# Patient Record
Sex: Male | Born: 1995 | Hispanic: No | Marital: Single | Smoking: Current every day smoker
Health system: Southern US, Community
[De-identification: ages and names within clinical notes are randomized; demographics above are authoritative.]

---
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
## 2017-08-21 ENCOUNTER — Encounter: Payer: Self-pay | Admitting: Emergency Medicine

## 2017-08-21 ENCOUNTER — Emergency Department

## 2017-08-21 ENCOUNTER — Emergency Department
Admission: EM | Admit: 2017-08-21 | Discharge: 2017-08-21 | Disposition: A | Discharge status: Home / Self Care | Attending: Emergency Medicine | Admitting: Emergency Medicine

## 2017-08-21 ENCOUNTER — Other Ambulatory Visit: Payer: Self-pay

## 2017-08-21 DIAGNOSIS — F17290 Nicotine dependence, other tobacco product, uncomplicated: Secondary | ICD-10-CM | POA: Diagnosis not present

## 2017-08-21 DIAGNOSIS — R1010 Upper abdominal pain, unspecified: Secondary | ICD-10-CM | POA: Insufficient documentation

## 2017-08-21 DIAGNOSIS — R070 Pain in throat: Secondary | ICD-10-CM | POA: Diagnosis not present

## 2017-08-21 LAB — URINALYSIS, COMPLETE (UACMP) WITH MICROSCOPIC
Bacteria, UA: NONE SEEN
Bilirubin Urine: NEGATIVE
GLUCOSE, UA: NEGATIVE mg/dL
Hgb urine dipstick: NEGATIVE
KETONES UR: NEGATIVE mg/dL
Leukocytes, UA: NEGATIVE
Nitrite: NEGATIVE
PH: 7 (ref 5.0–8.0)
Protein, ur: 30 mg/dL — AB
SPECIFIC GRAVITY, URINE: 1.03 (ref 1.005–1.030)

## 2017-08-21 LAB — CBC
HCT: 45.5 % (ref 40.0–52.0)
Hemoglobin: 15.7 g/dL (ref 13.0–18.0)
MCH: 29.9 pg (ref 26.0–34.0)
MCHC: 34.5 g/dL (ref 32.0–36.0)
MCV: 86.7 fL (ref 80.0–100.0)
PLATELETS: 332 10*3/uL (ref 150–440)
RBC: 5.25 MIL/uL (ref 4.40–5.90)
RDW: 12 % (ref 11.5–14.5)
WBC: 7.4 10*3/uL (ref 3.8–10.6)

## 2017-08-21 LAB — COMPREHENSIVE METABOLIC PANEL
ALK PHOS: 61 U/L (ref 38–126)
ALT: 18 U/L (ref 17–63)
AST: 25 U/L (ref 15–41)
Albumin: 4.3 g/dL (ref 3.5–5.0)
Anion gap: 11 (ref 5–15)
BILIRUBIN TOTAL: 0.8 mg/dL (ref 0.3–1.2)
BUN: 17 mg/dL (ref 6–20)
CALCIUM: 9.6 mg/dL (ref 8.9–10.3)
CO2: 28 mmol/L (ref 22–32)
CREATININE: 1.17 mg/dL (ref 0.61–1.24)
Chloride: 101 mmol/L (ref 101–111)
Glucose, Bld: 103 mg/dL — ABNORMAL HIGH (ref 65–99)
Potassium: 4.2 mmol/L (ref 3.5–5.1)
Sodium: 140 mmol/L (ref 135–145)
Total Protein: 9 g/dL — ABNORMAL HIGH (ref 6.5–8.1)

## 2017-08-21 LAB — LIPASE, BLOOD: Lipase: 25 U/L (ref 11–51)

## 2017-08-21 MED ORDER — PANTOPRAZOLE SODIUM 40 MG PO TBEC: 40.0000 mg | DELAYED_RELEASE_TABLET | Freq: Every day | ORAL | 1 refills | Status: AC

## 2017-08-21 MED ORDER — POLYETHYLENE GLYCOL 3350 17 G PO PACK: 17.0000 g | PACK | Freq: Every day | ORAL | 0 refills | Status: AC

## 2017-08-21 MED ORDER — GI COCKTAIL ~~LOC~~
30.0000 mL | Freq: Once | ORAL | Status: AC
  Administered 2017-08-21: 30 mL via ORAL
  Filled 2017-08-21: qty 30

## 2017-08-21 MED ORDER — SUCRALFATE 1 G PO TABS: 1.0000 g | ORAL_TABLET | Freq: Four times a day (QID) | ORAL | 1 refills | Status: AC

## 2017-08-21 MED ORDER — SODIUM CHLORIDE 0.9 % IV SOLN
Freq: Once | INTRAVENOUS | Status: AC
  Administered 2017-08-21: 14:00:00 via INTRAVENOUS

## 2017-08-21 NOTE — ED Notes (Signed)
Patient transported to X-ray 

## 2017-08-21 NOTE — ED Triage Notes (Signed)
Pt states upper abd pain for the past few days, hurts worse when he swallows, states he's lost 6lbs in the past 4 days, unable to eat at all. Denies injury to area. Appears in NAD.

## 2017-08-21 NOTE — ED Provider Notes (Signed)
[facility] Emergency Department Provider Note       Time seen: ----------------------------------------- 3:16 PM on 08/21/2017 -----------------------------------------   I have reviewed the triage vital signs and the nursing notes.  HISTORY   Chief Complaint Abdominal Pain    HPI Kyle Hodges is a 22 y.o. male with no significant past medical history who presents to the ED for upper abdominal pain for the past 2 days.  Patient states it hurts when he swallows, that he is lost 6 pounds in the past 4 days.  He is a been am unable to eat at all.  He states he has pushed through a pain associated with drinking and has been still drinking some water.  He denies fevers, chills or other complaints.  He describes taking over-the-counter antacids without any improvement.  History reviewed. No pertinent past medical history.  There are no active problems to display for this patient.   History reviewed. No pertinent surgical history.  Allergies Penicillins  Social History Social History   Tobacco Use  . Smoking status: Current Every Day Smoker    Types: E-cigarettes  . Smokeless tobacco: Former Engineer, water Use Topics  . Alcohol use: Not Currently  . Drug use: Not on file   Review of Systems Constitutional: Negative for fever. Eyes: Negative for vision changes ENT: Positive for pain with swallowing Cardiovascular: Negative for chest pain. Respiratory: Negative for shortness of breath. Gastrointestinal: Positive for abdominal pain Musculoskeletal: Negative for back pain. Skin: Negative for rash. Neurological: Negative for headaches, focal weakness or numbness.  All systems negative/normal/unremarkable except as stated in the HPI  ____________________________________________   PHYSICAL EXAM:  VITAL SIGNS: ED Triage Vitals [08/21/17 1338]  Enc Vitals Group     BP      Pulse      Resp      Temp      Temp src      SpO2      Weight  179 lb (81.2 kg)     Height  (1.854 m)     Head Circumference      Peak Flow      Pain Score 7     Pain Loc      Pain Edu?      Excl. in GC?    Constitutional: Alert and oriented. Well appearing and in no distress. Eyes: Conjunctivae are normal. Normal extraocular movements. ENT   Head: Normocephalic and atraumatic.   Nose: No congestion/rhinnorhea.   Mouth/Throat: Mucous membranes are moist.   Neck: No stridor. Cardiovascular: Normal rate, regular rhythm. No murmurs, rubs, or gallops. Respiratory: Normal respiratory effort without tachypnea nor retractions. Breath sounds are clear and equal bilaterally. No wheezes/rales/rhonchi. Gastrointestinal: Mild epigastric tenderness, no rebound or guarding.  Normal bowel sounds. Musculoskeletal: Nontender with normal range of motion in extremities. No lower extremity tenderness nor edema. Neurologic:  Normal speech and language. No gross focal neurologic deficits are appreciated.  Skin:  Skin is warm, dry and intact. No rash noted. Psychiatric: Mood and affect are normal. Speech and behavior are normal.  ____________________________________________  EKG: Interpreted by me.  Sinus rhythm the rate is 67 bpm, normal PR interval, normal QRS, normal QT.  ____________________________________________  ED COURSE:  As part of my medical decision making, I reviewed the following data within the electronic MEDICAL RECORD NUMBER History obtained from family if available, nursing notes, old chart and ekg, as well as notes from prior ED visits. Patient presented for epigastric pain  and pain with swallowing, we will assess with labs and imaging as indicated at this time.   Procedures ____________________________________________   LABS (pertinent positives/negatives)  Labs Reviewed  COMPREHENSIVE METABOLIC PANEL - Abnormal; Notable for the following components:      Result Value   Glucose, Bld 103 (*)    Total Protein 9.0 (*)    All  other components within normal limits  URINALYSIS, COMPLETE (UACMP) WITH MICROSCOPIC - Abnormal; Notable for the following components:   Color, Urine AMBER (*)    APPearance CLEAR (*)    Protein, ur 30 (*)    All other components within normal limits  LIPASE, BLOOD  CBC    RADIOLOGY  Abdomen 2 view IMPRESSION: Diffuse stool throughout much of colon. Question underlying constipation. No bowel obstruction or free air.  ____________________________________________  DIFFERENTIAL DIAGNOSIS   GERD, peptic ulcer disease, dehydration, electrolyte abnormality  FINAL ASSESSMENT AND PLAN  GERD, constipation   Plan: The patient had presented for upper abdominal pain which is likely GERD or ulcer related. Patient's labs are negative. Patient's imaging did reveal some constipation.  I will prescribe an antacid Carafate and MiraLAX.  He will be referred to GI for close outpatient follow-up.   Ulice Dash, MD   Note: This note was generated in part or whole with voice recognition software. Voice recognition is usually quite accurate but there are transcription errors that can and very often do occur. I apologize for any typographical errors that were not detected and corrected.     Emily Filbert, MD 08/21/17 [id number]

## 2017-08-29 ENCOUNTER — Encounter: Payer: Self-pay | Admitting: Emergency Medicine

## 2017-08-29 ENCOUNTER — Other Ambulatory Visit: Payer: Self-pay

## 2017-08-29 ENCOUNTER — Emergency Department
Admission: EM | Admit: 2017-08-29 | Discharge: 2017-08-29 | Disposition: A | Discharge status: Home / Self Care | Attending: Emergency Medicine | Admitting: Emergency Medicine

## 2017-08-29 DIAGNOSIS — Z139 Encounter for screening, unspecified: Secondary | ICD-10-CM

## 2017-08-29 DIAGNOSIS — R634 Abnormal weight loss: Secondary | ICD-10-CM | POA: Diagnosis not present

## 2017-08-29 NOTE — ED Provider Notes (Signed)
[facility] Emergency Department Provider Note    First MD Initiated Contact with Patient 08/29/17 0214     (approximate)  I have reviewed the triage vital signs and the nursing notes.   HISTORY  Chief Complaint Letter for School/Work    HPI Kyle Hodges is a 22 y.o. 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  Patient was seen in the emergency department on 08/21/2017 secondary to upper abdominal discomfort with worsening pain with eating or drinking.  Patient was prescribed Carafate and referred to GI.  Patient states that symptoms have since improved however he has lost in total 13 pounds and does not feel physically capable to do the exercises that he would have to do this coming Saturday.   Past medical history  There are no active problems to display for this patient.   History reviewed. No pertinent surgical history.  Prior to Admission medications   Medication Sig Start Date End Date Taking? Authorizing Provider  pantoprazole (PROTONIX) 40 MG tablet Take 1 tablet (40 mg total) by mouth daily. 08/21/17 08/21/18  Emily Filbert, MD  polyethylene glycol (MIRALAX / Ethelene Hal) packet Take 17 g by mouth daily. 08/21/17   Emily Filbert, MD  sucralfate (CARAFATE) 1 g tablet Take 1 tablet (1 g total) by mouth 4 (four) times daily. 08/21/17 08/21/18  Emily Filbert, MD    Allergies Penicillins  No family history on file.  Social History Social History   Tobacco Use  . Smoking status: Current Every Day Smoker    Types: E-cigarettes  . Smokeless tobacco: Former Engineer, water Use Topics  . Alcohol use: Not Currently  . Drug use: Not on file    Review of Systems Constitutional: No fever/chills Eyes: No visual changes. ENT: No sore throat. Cardiovascular: Denies chest pain. Respiratory: Denies shortness of breath. Gastrointestinal:  No abdominal pain.  No nausea, no vomiting.  No diarrhea.  No constipation. Genitourinary: Negative for dysuria. Musculoskeletal: Negative for neck pain.  Negative for back pain. Integumentary: Negative for rash. Neurological: Negative for headaches, focal weakness or numbness.   ____________________________________________   PHYSICAL EXAM:  VITAL SIGNS: ED Triage Vitals  Enc Vitals Group     BP 08/29/17 0036 123/65     Pulse Rate 08/29/17 0036 (!) 48     Resp --      Temp 08/29/17 0036 98.2 F (36.8 C)     Temp Source 08/29/17 0036 Oral     SpO2 08/29/17 0036 100 %     Weight 08/29/17 0005 78.9 kg (174 lb)     Height 08/29/17 0005 1.854 m (6\' 1" )     Head Circumference --      Peak Flow --      Pain Score 08/29/17 0009 0     Pain Loc --      Pain Edu? --      Excl. in GC? --     Constitutional: Alert and oriented. Well appearing and in no acute distress. Eyes: Conjunctivae are normal.  Head: Atraumatic. Mouth/Throat: Mucous membranes are moist. Oropharynx non-erythematous. Neck: No stridor.  Cardiovascular: Normal rate, regular rhythm. Good peripheral circulation. Grossly normal heart sounds. Respiratory: Normal respiratory effort.  No retractions. Lungs CTAB. Gastrointestinal: Soft and nontender. No distention.  Musculoskeletal: No lower extremity tenderness nor edema. No gross deformities of extremities. Neurologic:  Normal speech and language. No gross focal  neurologic deficits are appreciated.  Skin:  Skin is warm, dry and intact. No rash noted. Psychiatric: Mood and affect are normal. Speech and behavior are normal.    Procedures   ____________________________________________   INITIAL IMPRESSION / ASSESSMENT AND PLAN / ED COURSE  As part of my medical decision making, I reviewed the following data within the electronic MEDICAL RECORD NUMBER   22 year old male presenting with above-stated history and physical exam requesting excuse from physical training in  the [employer] this Saturday.  View of the patient's chart revealed that he has lost an additional 5 pounds since evaluation to Dr. Mayford KnifeWilliams and as such encourage patient to follow-up with GI.  Patient states that symptoms have improved and he is now able to tolerate eating and drinking. ____________________________________________  FINAL CLINICAL IMPRESSION(S) / ED DIAGNOSES  Final diagnoses:  Encounter for medical screening examination     MEDICATIONS GIVEN DURING THIS VISIT:  Medications - No data to display   ED Discharge Orders    None       Note:  This document was prepared using Dragon voice recognition software and may include unintentional dictation errors.    Darci CurrentBrown, Aledo N, MD 08/29/17 [phone]

## 2017-08-29 NOTE — ED Triage Notes (Signed)
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

## 2019-10-05 IMAGING — CR DG ABDOMEN 2V
1 series · 3 of 3 positions shown · non-contrast
Comparison: None.

CLINICAL DATA: Abdominal pain

EXAM:
ABDOMEN - 2 VIEW

[Series 1: dg abd 2 views · 0.14mm/px · 3 of 3 slices shown]
[im 1/3]
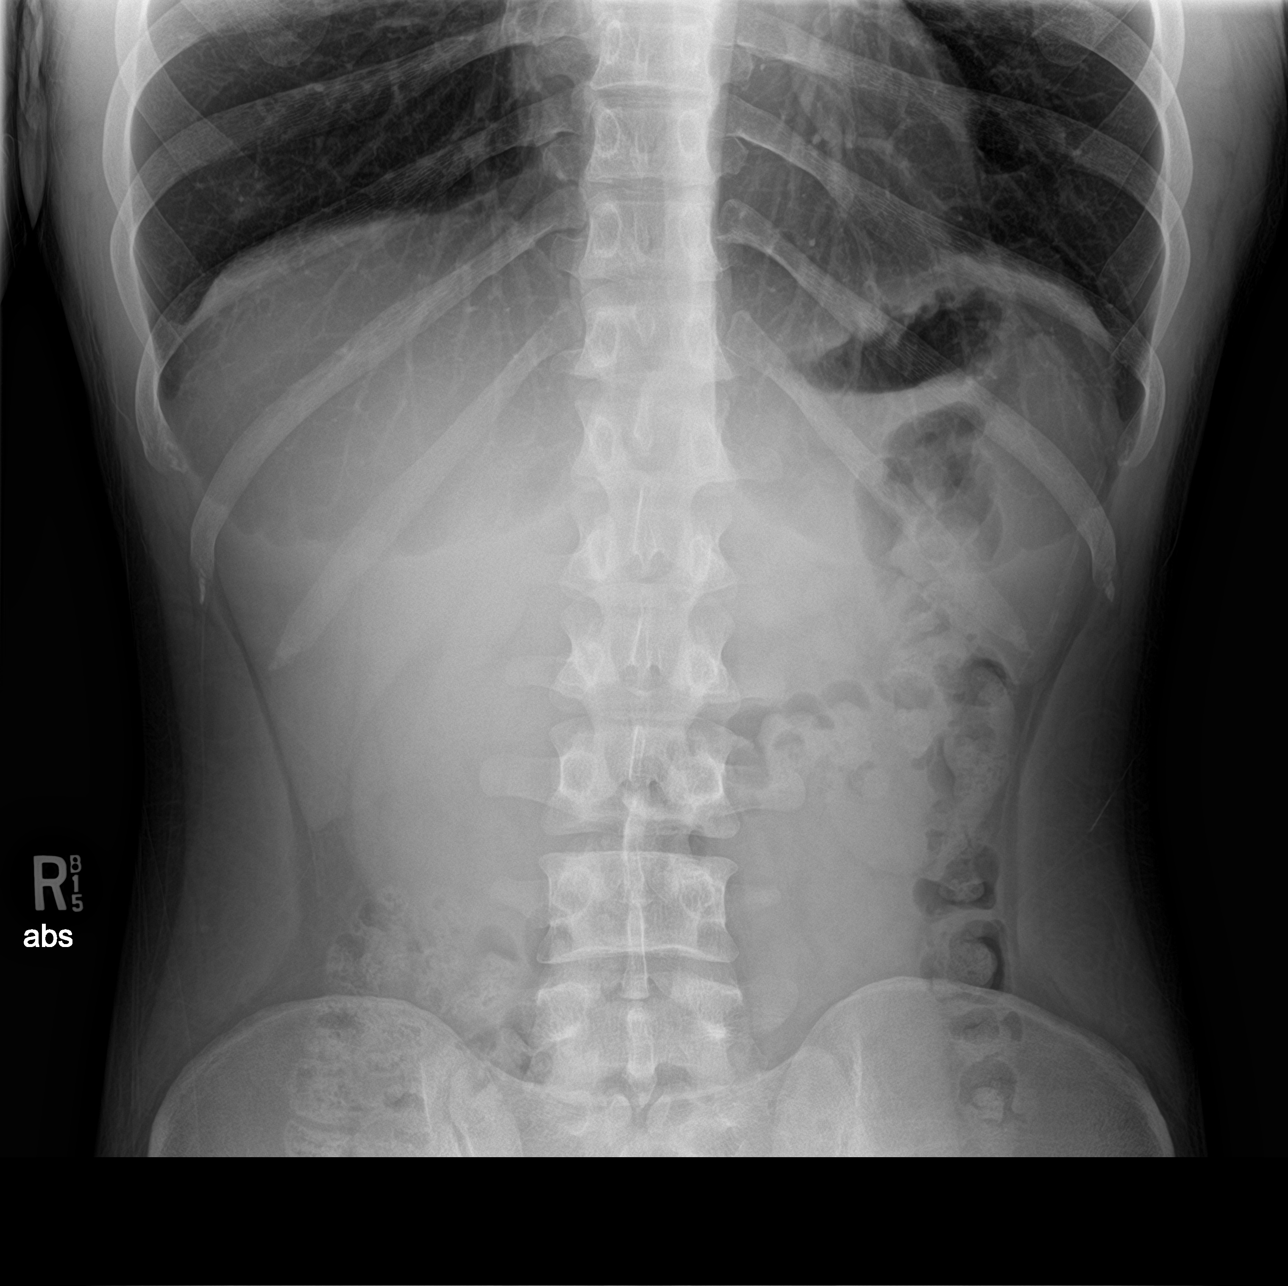
[im 2/3]
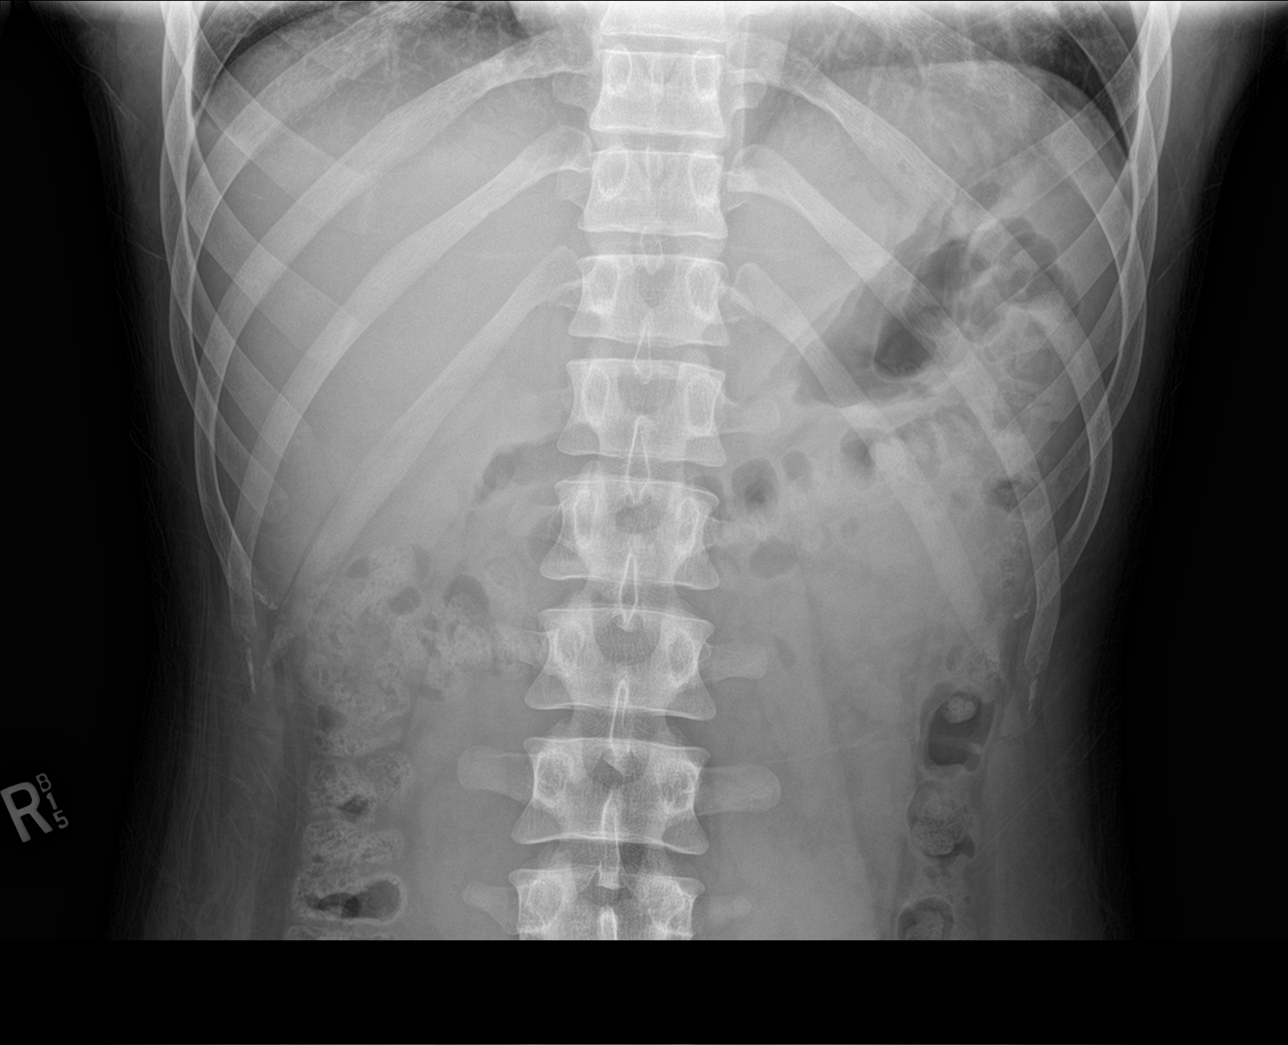
[im 3/3]
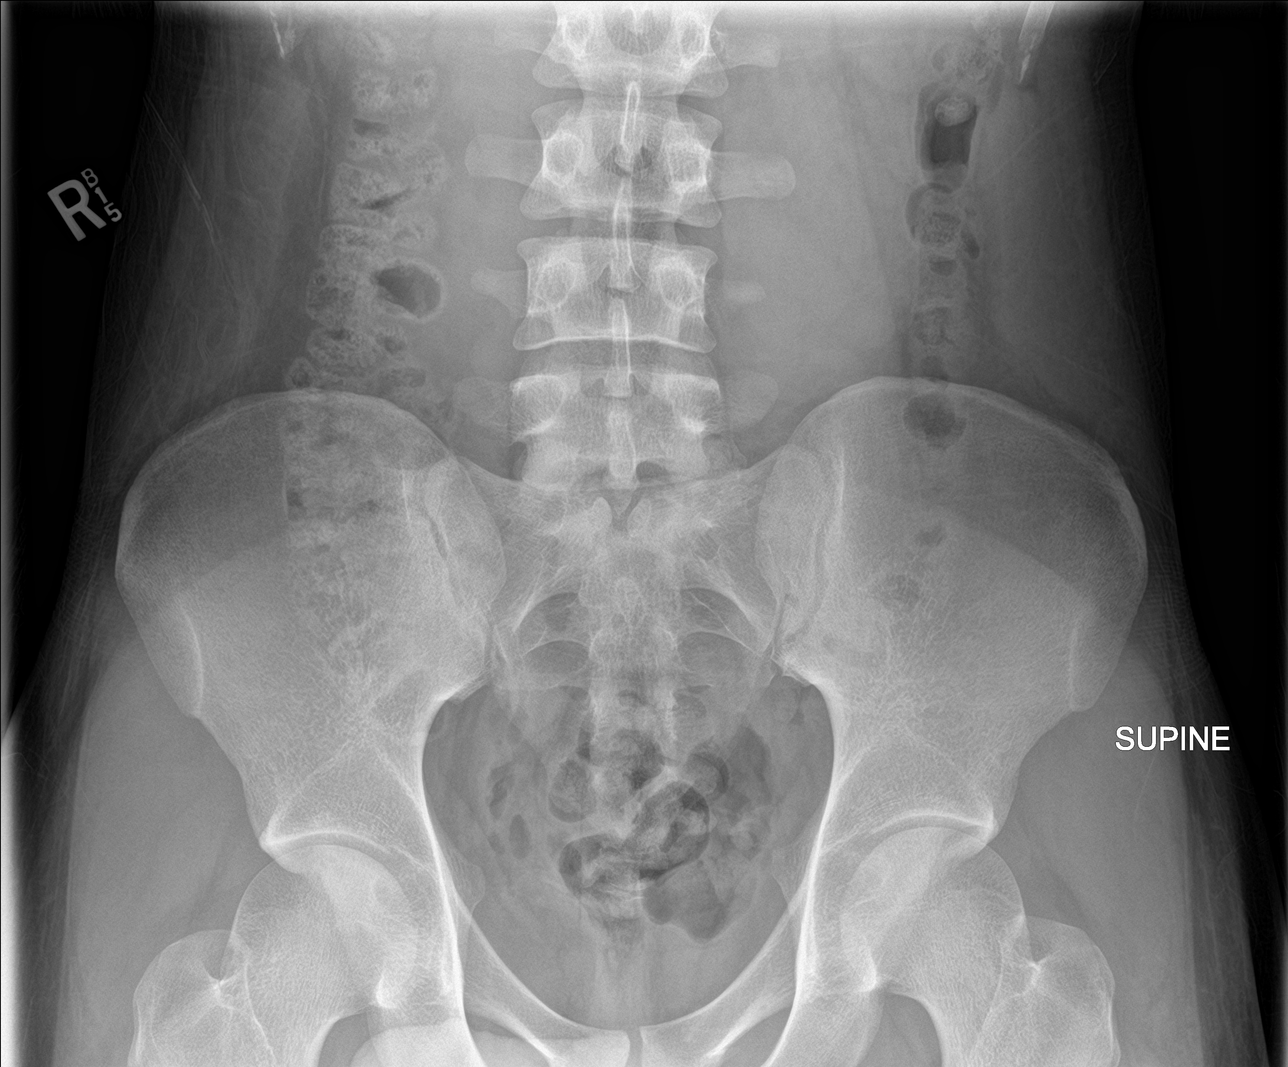

[3 of 3 positions shown; findings below may reference images not displayed]

FINDINGS: Supine and upright images were obtained. There is diffuse stool
throughout much of the colon. There is no bowel dilatation or
air-fluid level to suggest bowel obstruction. No free air. Lung
bases are clear. No abnormal calcifications.
IMPRESSION: Diffuse stool throughout much of colon. Question underlying
constipation. No bowel obstruction or free air.
